# Patient Record
Sex: Male | Born: 1990 | Race: Black or African American | Hispanic: No | Marital: Single | Smoking: Current every day smoker
Health system: Southern US, Community
[De-identification: ages and names within clinical notes are randomized; demographics above are authoritative.]

---
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
## 2010-07-06 ENCOUNTER — Telehealth: Payer: Self-pay | Admitting: Orthopedic Surgery

## 2010-09-08 NOTE — Progress Notes (Signed)
Summary: patient scheduled today  Phone Note Call from Patient   Caller: Patient Summary of Call: Patient called day of appt 07/06/10( scheduled 2:15pm,) at 2:27pm and was 15 to 20 minutes away.  We advised need to to reschedule.  (Note - reminder call made 1 day prior, requested  to come 15 min early to register (new patient paperwork, etc).  He hung up on Brenda.   Initial call taken by: Cammie Sickle,  July 06, 2010 4:45 PM  Follow-up for Phone Call        Patient's mother called back regarding re-schedule. States patient was in class, taking a final.   I offered the first available appt-07/20/10.  Mother states she will have him go to another doctor.   Follow-up by: Cammie Sickle,  July 06, 2010 4:47 PM

## 2011-09-24 ENCOUNTER — Emergency Department (HOSPITAL_COMMUNITY)
Admission: EM | Admit: 2011-09-24 | Discharge: 2011-09-24 | Disposition: A | Discharge status: Home / Self Care | Payer: BC Managed Care – PPO | Attending: Emergency Medicine | Admitting: Emergency Medicine

## 2011-09-24 ENCOUNTER — Encounter (HOSPITAL_COMMUNITY): Payer: Self-pay

## 2011-09-24 DIAGNOSIS — J029 Acute pharyngitis, unspecified: Secondary | ICD-10-CM | POA: Insufficient documentation

## 2011-09-24 LAB — RAPID STREP SCREEN (MED CTR MEBANE ONLY): Streptococcus, Group A Screen (Direct): NEGATIVE

## 2011-09-24 MED ORDER — IBUPROFEN 600 MG PO TABS
600.0000 mg | ORAL_TABLET | Freq: Four times a day (QID) | ORAL | Status: AC | PRN
Start: 2011-09-24 — End: 2011-10-04

## 2011-09-24 MED ORDER — LIDOCAINE VISCOUS 2 % MT SOLN
20.0000 mL | Freq: Once | OROMUCOSAL | Status: AC
  Administered 2011-09-24: 15 mL via OROMUCOSAL
  Filled 2011-09-24: qty 15

## 2011-09-24 MED ORDER — LIDOCAINE VISCOUS 2 % MT SOLN: OROMUCOSAL | Status: DC

## 2011-09-24 NOTE — Discharge Instructions (Signed)

## 2011-09-24 NOTE — ED Notes (Signed)
Pt presents with sore throat x 2 days. 

## 2011-09-24 NOTE — ED Provider Notes (Signed)
History     CSN: [id number]  Arrival date & time 09/24/11  2034   First MD Initiated Contact with Patient 09/24/11 2046      Chief Complaint  Patient presents with  . Sore Throat    (Consider location/radiation/quality/duration/timing/severity/associated sxs/prior treatment) Patient is a 21 y.o. male presenting with pharyngitis. The history is provided by the patient.  Sore Throat This is a new problem. The current episode started yesterday. The problem occurs constantly. The problem has been unchanged. Associated symptoms include congestion and a sore throat. Pertinent negatives include no abdominal pain, arthralgias, chest pain, chills, coughing, fever, headaches, joint swelling, nausea, neck pain, numbness, rash, swollen glands or weakness. The symptoms are aggravated by swallowing. He has tried acetaminophen and NSAIDs for the symptoms. The treatment provided no relief.    History reviewed. No pertinent past medical history.  History reviewed. No pertinent past surgical history.  No family history on file.  History  Substance Use Topics  . Smoking status: Never Smoker   . Smokeless tobacco: Not on file  . Alcohol Use: Yes      Review of Systems  Constitutional: Negative for fever and chills.  HENT: Positive for congestion and sore throat. Negative for ear pain, rhinorrhea, neck pain, neck stiffness and voice change.   Eyes: Negative.   Respiratory: Negative for cough, chest tightness and shortness of breath.   Cardiovascular: Negative for chest pain.  Gastrointestinal: Negative for nausea and abdominal pain.  Genitourinary: Negative.   Musculoskeletal: Negative for joint swelling and arthralgias.  Skin: Negative.  Negative for rash and wound.  Neurological: Negative for weakness, numbness and headaches.  Hematological: Negative.   Psychiatric/Behavioral: Negative.     Allergies  Review of patient's allergies indicates no known allergies.  Home Medications    Current Outpatient Rx  Name Route Sig Dispense Refill  . IBUPROFEN 600 MG PO TABS Oral Take 1 tablet (600 mg total) by mouth every 6 (six) hours as needed for pain. 30 tablet 0  . LIDOCAINE VISCOUS 2 % MT SOLN  Gargle and spit every 3 hours as needed for pain relief 100 mL 0    BP 143/79  Pulse 65  Temp(Src) 97.9 F (36.6 C) (Oral)  Resp 20  Ht 5\' 6"  (1.676 m)  Wt 190 lb (86.183 kg)  BMI 30.67 kg/m2  SpO2 99%  Physical Exam  Nursing note and vitals reviewed. Constitutional: He is oriented to person, place, and time. He appears well-developed and well-nourished.  HENT:  Head: Normocephalic and atraumatic.  Right Ear: Tympanic membrane and ear canal normal.  Left Ear: Tympanic membrane and ear canal normal.  Nose: Mucosal edema present.  Mouth/Throat: Uvula is midline and mucous membranes are normal. Posterior oropharyngeal erythema present. No oropharyngeal exudate, posterior oropharyngeal edema or tonsillar abscesses.  Eyes: Conjunctivae are normal.  Neck: Normal range of motion.  Cardiovascular: Normal rate, regular rhythm, normal heart sounds and intact distal pulses.   Pulmonary/Chest: Effort normal and breath sounds normal. He has no wheezes.  Abdominal: Soft. Bowel sounds are normal. There is no tenderness.  Musculoskeletal: Normal range of motion.  Neurological: He is alert and oriented to person, place, and time.  Skin: Skin is warm and dry.  Psychiatric: He has a normal mood and affect.    ED Course  Procedures (including critical care time)   Labs Reviewed  RAPID STREP SCREEN   No results found.   1. Viral pharyngitis       MDM  Rapid  strep negative.  Ibuprofen,  Lidocaine.  Rest,  Fluids.          Candis Musa, PA 09/24/11 2318

## 2011-09-25 NOTE — ED Provider Notes (Signed)
Medical screening examination/treatment/procedure(s) were performed by non-physician practitioner and as supervising physician I was immediately available for consultation/collaboration.   Ayson Cherubini W. Taliya Mcclard, MD 09/25/11 1113 

## 2012-03-22 ENCOUNTER — Encounter (HOSPITAL_COMMUNITY): Payer: Self-pay | Admitting: Emergency Medicine

## 2012-03-22 ENCOUNTER — Emergency Department (HOSPITAL_COMMUNITY)
Admission: EM | Admit: 2012-03-22 | Discharge: 2012-03-22 | Disposition: A | Discharge status: Home / Self Care | Payer: No Typology Code available for payment source | Attending: Emergency Medicine | Admitting: Emergency Medicine

## 2012-03-22 DIAGNOSIS — Y93I9 Activity, other involving external motion: Secondary | ICD-10-CM | POA: Insufficient documentation

## 2012-03-22 DIAGNOSIS — S39012A Strain of muscle, fascia and tendon of lower back, initial encounter: Secondary | ICD-10-CM

## 2012-03-22 DIAGNOSIS — S335XXA Sprain of ligaments of lumbar spine, initial encounter: Secondary | ICD-10-CM | POA: Insufficient documentation

## 2012-03-22 DIAGNOSIS — Y998 Other external cause status: Secondary | ICD-10-CM | POA: Insufficient documentation

## 2012-03-22 MED ORDER — METHOCARBAMOL 500 MG PO TABS: ORAL_TABLET | ORAL | Status: DC

## 2012-03-22 MED ORDER — MELOXICAM 7.5 MG PO TABS: ORAL_TABLET | ORAL | Status: DC
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
## 2012-03-22 NOTE — ED Provider Notes (Signed)
Medical screening examination/treatment/procedure(s) were performed by non-physician practitioner and as supervising physician I was immediately available for consultation/collaboration.   Laray Anger, DO 03/22/12 1849

## 2012-03-22 NOTE — ED Notes (Signed)
H. Bryant, PA at bedside. 
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
## 2012-07-10 ENCOUNTER — Emergency Department (INDEPENDENT_AMBULATORY_CARE_PROVIDER_SITE_OTHER)
Admission: EM | Admit: 2012-07-10 | Discharge: 2012-07-10 | Disposition: A | Discharge status: Home / Self Care | Payer: BC Managed Care – PPO | Source: Home / Self Care | Attending: Emergency Medicine | Admitting: Emergency Medicine

## 2012-07-10 ENCOUNTER — Encounter (HOSPITAL_COMMUNITY): Payer: Self-pay | Admitting: *Deleted

## 2012-07-10 DIAGNOSIS — R609 Edema, unspecified: Secondary | ICD-10-CM

## 2012-07-10 DIAGNOSIS — B349 Viral infection, unspecified: Secondary | ICD-10-CM

## 2012-07-10 DIAGNOSIS — B9789 Other viral agents as the cause of diseases classified elsewhere: Secondary | ICD-10-CM

## 2012-07-10 LAB — POCT I-STAT, CHEM 8
BUN: 10 mg/dL (ref 6–23)
Calcium, Ion: 1.26 mmol/L — ABNORMAL HIGH (ref 1.12–1.23)
Creatinine, Ser: 1.1 mg/dL (ref 0.50–1.35)
HCT: 48 % (ref 39.0–52.0)
Potassium: 4.4 mEq/L (ref 3.5–5.1)
Sodium: 141 mEq/L (ref 135–145)
TCO2: 28 mmol/L (ref 0–100)

## 2012-07-10 LAB — POCT URINALYSIS DIP (DEVICE)
Glucose, UA: NEGATIVE mg/dL
Ketones, ur: NEGATIVE mg/dL
Specific Gravity, Urine: 1.02 (ref 1.005–1.030)
Urobilinogen, UA: 0.2 mg/dL (ref 0.0–1.0)

## 2012-07-10 MED ORDER — DIPHENOXYLATE-ATROPINE 2.5-0.025 MG PO TABS: 1.0000 | ORAL_TABLET | Freq: Four times a day (QID) | ORAL | Status: DC | PRN

## 2012-07-10 MED ORDER — BENZONATATE 200 MG PO CAPS: 200.0000 mg | ORAL_CAPSULE | Freq: Three times a day (TID) | ORAL | Status: DC | PRN

## 2012-07-10 MED ORDER — FEXOFENADINE-PSEUDOEPHED ER 60-120 MG PO TB12: 1.0000 | ORAL_TABLET | Freq: Two times a day (BID) | ORAL | Status: DC

## 2012-07-10 NOTE — ED Provider Notes (Signed)
Chief Complaint  Patient presents with  . Cough    History of Present Illness:   Jacob Webster is a 21 year old male who presents today with several problems: Swelling of the toes of both feet, postural dizziness, weakness, nasal congestion, chest pain, cough, and diarrhea. The swelling of the toes goes back to yesterday. He denies any swelling of the feet or ankles, just the toes. They're not painful. He denies any pain in the legs or calf. He's had no PND, orthopnea, dyspnea on exertion, or shortness of breath. He's also had postural dizziness since yesterday. He states whenever he gets up from a sitting or lying position he feels lightheaded. He denies any vertigo or syncope. He feels weak in his arms and legs. There is no numbness or tingling. No headache or other neurological symptoms. He's also had a two-day history of nasal congestion, rhinorrhea, and cough productive of brown sputum. His stools have been loose. He denies any abdominal pain, nausea, or vomiting. He has sharp chest pain for the past 2 weeks it comes and goes but is less submammary and last for seconds at a time.  Review of Systems:  Other than noted above, the patient denies any of the following symptoms. Systemic:  No fever, chills, sweats, fatigue, myalgias, headache, or anorexia. Eye:  No redness, pain or drainage. ENT:  No earache, ear congestion, nasal congestion, sneezing, rhinorrhea, sinus pressure, sinus pain, post nasal drip, or sore throat. Lungs:  No cough, sputum production, wheezing, shortness of breath, or chest pain. GI:  No abdominal pain, nausea, vomiting, or diarrhea.  PMFSH:  Past medical history, family history, social history, meds, and allergies were reviewed.  Physical Exam:   Vital signs:  BP 123/75  Pulse 77  Temp 99.1 F (37.3 C) (Oral)  Resp 17  SpO2 100% Filed Vitals:   07/10/12 1233 07/10/12 1318 Supine  07/10/12 1318 Sitting  07/10/12 1319 Standing   BP: 120/77 122/79 116/56 123/75  Pulse: 73  68 72 77  Temp: 99.1 F (37.3 C)     TempSrc: Oral     Resp: 17     SpO2: 100%      General:  Alert, in no distress. Eye:  No conjunctival injection or drainage. Lids were normal. ENT:  TMs and canals were normal, without erythema or inflammation.  Nasal mucosa was clear and uncongested, without drainage.  Mucous membranes were moist.  Pharynx was clear, without exudate or drainage.  There were no oral ulcerations or lesions. Neck:  Supple, no adenopathy, tenderness or mass. Lungs:  No respiratory distress.  Lungs were clear to auscultation, without wheezes, rales or rhonchi.  Breath sounds were clear and equal bilaterally.  Heart:  Regular rhythm, without gallops, murmers or rubs. Abdomen: Soft, nontender, no organomegaly or mass. Extremities: No obvious edema of the toes, feet, ankles. There is no pitting edema. No tenderness to palpation. No calf tenderness, and Homans sign is negative. There is no distention of the veins or palpable cord. Skin:  Clear, warm, and dry, without rash or lesions.  Labs:   Results for orders placed during the hospital encounter of 07/10/12  POCT URINALYSIS DIP (DEVICE)      Component Value Range   Glucose, UA NEGATIVE  NEGATIVE mg/dL   Bilirubin Urine NEGATIVE  NEGATIVE   Ketones, ur NEGATIVE  NEGATIVE mg/dL   Specific Gravity, Urine 1.020  1.005 - 1.030   Hgb urine dipstick NEGATIVE  NEGATIVE   pH 7.0  5.0 - 8.0  Protein, ur NEGATIVE  NEGATIVE mg/dL   Urobilinogen, UA 0.2  0.0 - 1.0 mg/dL   Nitrite NEGATIVE  NEGATIVE   Leukocytes, UA NEGATIVE  NEGATIVE  POCT I-STAT, CHEM 8      Component Value Range   Sodium 141  135 - 145 mEq/L   Potassium 4.4  3.5 - 5.1 mEq/L   Chloride 104  96 - 112 mEq/L   BUN 10  6 - 23 mg/dL   Creatinine, Ser 1.61  0.50 - 1.35 mg/dL   Glucose, Bld 95  70 - 99 mg/dL   Calcium, Ion 0.96 (*) 1.12 - 1.23 mmol/L   TCO2 28  0 - 100 mmol/L   Hemoglobin 16.3  13.0 - 17.0 g/dL   HCT 04.5  40.9 - 81.1 %    Date: 07/10/2012   Rate: 61  Rhythm: normal sinus rhythm  QRS Axis: normal  Intervals: normal  ST/T Wave abnormalities: normal  Conduction Disutrbances:none  Narrative Interpretation: Normal sinus rhythm with sinus arrhythmia, normal EKG.  Old EKG Reviewed: none available  Assessment:  The primary encounter diagnosis was Edema. A diagnosis of Viral syndrome was also pertinent to this visit.  I am not certain as to the cause of his edema. I cannot appreciate any definite edema of the toes, feet, ankles today. He has not had any swelling of his hands, abdomen, or face. I suggest he followup with a primary care Dr. if this has not resolved within the next few days.  Plan:   1.  The following meds were prescribed:   New Prescriptions   BENZONATATE (TESSALON) 200 MG CAPSULE    Take 1 capsule (200 mg total) by mouth 3 (three) times daily as needed for cough.   DIPHENOXYLATE-ATROPINE (LOMOTIL) 2.5-0.025 MG PER TABLET    Take 1 tablet by mouth 4 (four) times daily as needed for diarrhea or loose stools.   FEXOFENADINE-PSEUDOEPHEDRINE (ALLEGRA-D) 60-120 MG PER TABLET    Take 1 tablet by mouth every 12 (twelve) hours.   2.  The patient was instructed in symptomatic care and handouts were given. 3.  The patient was told to return if becoming worse in any way, if no better in 3 or 4 days, and given some red flag symptoms that would indicate earlier return.   Reuben Likes, MD 07/10/12 [phone]

## 2012-07-10 NOTE — ED Notes (Signed)
Pt  Has   Symptoms  Of  Non        Productive cough      With  Sinus    Congestion        sorethroat     And  Feet swelling        Pt   Also  Reports  Being  Dizzy        As  Well

## 2013-02-28 ENCOUNTER — Emergency Department (HOSPITAL_COMMUNITY)
Admission: EM | Admit: 2013-02-28 | Discharge: 2013-02-28 | Disposition: A | Discharge status: Home / Self Care | Payer: BC Managed Care – PPO | Attending: Emergency Medicine | Admitting: Emergency Medicine

## 2013-02-28 ENCOUNTER — Encounter (HOSPITAL_COMMUNITY): Payer: Self-pay | Admitting: Emergency Medicine

## 2013-02-28 DIAGNOSIS — F17200 Nicotine dependence, unspecified, uncomplicated: Secondary | ICD-10-CM | POA: Insufficient documentation

## 2013-02-28 DIAGNOSIS — B029 Zoster without complications: Secondary | ICD-10-CM

## 2013-02-28 DIAGNOSIS — Z79899 Other long term (current) drug therapy: Secondary | ICD-10-CM | POA: Insufficient documentation

## 2013-02-28 MED ORDER — ONDANSETRON 4 MG PO TBDP: 4.0000 mg | ORAL_TABLET | Freq: Three times a day (TID) | ORAL | Status: DC | PRN

## 2013-02-28 MED ORDER — GABAPENTIN 400 MG PO CAPS
400.0000 mg | ORAL_CAPSULE | Freq: Once | ORAL | Status: AC
  Administered 2013-02-28: 400 mg via ORAL
  Filled 2013-02-28: qty 1

## 2013-02-28 MED ORDER — HYDROCODONE-ACETAMINOPHEN 5-325 MG PO TABS
1.0000 | ORAL_TABLET | Freq: Once | ORAL | Status: AC
  Administered 2013-02-28: 1 via ORAL
  Filled 2013-02-28: qty 1

## 2013-02-28 MED ORDER — PREDNISONE 10 MG PO TABS
60.0000 mg | ORAL_TABLET | Freq: Once | ORAL | Status: AC
  Administered 2013-02-28: 60 mg via ORAL
  Filled 2013-02-28: qty 1

## 2013-02-28 MED ORDER — PREDNISONE 10 MG PO TABS: 20.0000 mg | ORAL_TABLET | Freq: Every day | ORAL | Status: DC

## 2013-02-28 MED ORDER — GABAPENTIN 300 MG PO CAPS: 300.0000 mg | ORAL_CAPSULE | Freq: Three times a day (TID) | ORAL | Status: DC

## 2013-02-28 MED ORDER — ACYCLOVIR 800 MG PO TABS
800.0000 mg | ORAL_TABLET | Freq: Once | ORAL | Status: AC
Start: 2013-02-28 — End: 2013-02-28
  Administered 2013-02-28: 800 mg via ORAL
  Filled 2013-02-28: qty 1

## 2013-02-28 MED ORDER — ACYCLOVIR 400 MG PO TABS: 800.0000 mg | ORAL_TABLET | Freq: Every day | ORAL | Status: DC

## 2013-02-28 MED ORDER — HYDROCODONE-ACETAMINOPHEN 5-325 MG PO TABS: 1.0000 | ORAL_TABLET | ORAL | Status: DC | PRN

## 2013-02-28 NOTE — ED Notes (Signed)
Pt sleeping at present time.  Respirations regular, even and unlabored.   

## 2013-02-28 NOTE — ED Notes (Signed)
Patient c/o rash to left side that spreads from front to back; states stings and itches.  Patient states has been using Calamine lotion without relief.

## 2013-02-28 NOTE — ED Provider Notes (Signed)
CSN: [id number]     Arrival date & time 02/28/13  0024 History     First MD Initiated Contact with Patient 02/28/13 0053     Chief Complaint  Patient presents with  . Rash   (Consider location/radiation/quality/duration/timing/severity/associated sxs/prior Treatment) HPI HPI Comments: Jacob Webster is a 22 y.o. male who presents to the Emergency Department complaining of rash to his left side that has been present for several days. It started out as two spots and has now spread to the back and front. He thinks it is poison oak or ivy. He has treated it with calamine lotion with no effect.  PCP Dr. Olena Leatherwood History reviewed. No pertinent past medical history. History reviewed. No pertinent past surgical history. No family history on file. History  Substance Use Topics  . Smoking status: Current Every Day Smoker  . Smokeless tobacco: Not on file  . Alcohol Use: Yes    Review of Systems  Skin:       rash    Allergies  Review of patient's allergies indicates no known allergies.  Home Medications   Current Outpatient Rx  Name  Route  Sig  Dispense  Refill  . acyclovir (ZOVIRAX) 400 MG tablet   Oral   Take 2 tablets (800 mg total) by mouth 5 (five) times daily.   50 tablet   0   . benzonatate (TESSALON) 200 MG capsule   Oral   Take 1 capsule (200 mg total) by mouth 3 (three) times daily as needed for cough.   30 capsule   0   . diphenoxylate-atropine (LOMOTIL) 2.5-0.025 MG per tablet   Oral   Take 1 tablet by mouth 4 (four) times daily as needed for diarrhea or loose stools.   16 tablet   0   . fexofenadine-pseudoephedrine (ALLEGRA-D) 60-120 MG per tablet   Oral   Take 1 tablet by mouth every 12 (twelve) hours.   30 tablet   0   . gabapentin (NEURONTIN) 300 MG capsule   Oral   Take 1 capsule (300 mg total) by mouth 3 (three) times daily.   30 capsule   0   . HYDROcodone-acetaminophen (NORCO/VICODIN) 5-325 MG per tablet   Oral   Take 1 tablet by mouth  every 4 (four) hours as needed for pain.   15 tablet   0   . meloxicam (MOBIC) 7.5 MG tablet      1 po bid with food   12 tablet   0   . methocarbamol (ROBAXIN) 500 MG tablet      2 po tid for spasm   30 tablet   0   . ondansetron (ZOFRAN ODT) 4 MG disintegrating tablet   Oral   Take 1 tablet (4 mg total) by mouth every 8 (eight) hours as needed for nausea.   20 tablet   0   . predniSONE (DELTASONE) 10 MG tablet   Oral   Take 2 tablets (20 mg total) by mouth daily.   10 tablet   0    BP 139/75  Pulse 73  Temp(Src) 98.2 F (36.8 C) (Oral)  Resp 18  Ht 5\' 5"  (1.651 m)  Wt 190 lb (86.183 kg)  BMI 31.62 kg/m2  SpO2 99% Physical Exam  Nursing note and vitals reviewed. Constitutional: He appears well-developed and well-nourished.  Awake, alert, nontoxic appearance.  HENT:  Head: Normocephalic and atraumatic.  Eyes: EOM are normal. Pupils are equal, round, and reactive to light.  Neck: Normal  range of motion. Neck supple.  Cardiovascular: Normal rate and intact distal pulses.   Pulmonary/Chest: Effort normal and breath sounds normal. He exhibits no tenderness.  Abdominal: Soft. There is no tenderness. There is no rebound.  Musculoskeletal: He exhibits no tenderness.  Baseline ROM, no obvious new focal weakness.  Neurological:  Mental status and motor strength appears baseline for patient and situation.  Skin: No rash noted.  Raised vesicular rash in T11 distribution with no crossing over the midline in either the back or front c/w shingles  Psychiatric: He has a normal mood and affect.    ED Course   Procedures (including critical care time)  Medications  acyclovir (ZOVIRAX) tablet 800 mg (800 mg Oral Given 02/28/13 0218)  gabapentin (NEURONTIN) capsule 400 mg (400 mg Oral Given 02/28/13 0218)  HYDROcodone-acetaminophen (NORCO/VICODIN) 5-325 MG per tablet 1 tablet (1 tablet Oral Given 02/28/13 0218)  predniSONE (DELTASONE) tablet 60 mg (60 mg Oral Given 02/28/13  0218)    No results found. 1. Shingles     MDM  Patient presents with a rash in the T11 distribution c/w shingles. Treated with initial doses of acyclovir, neurontin, vicodin, and prednisone.Pt stable in ED with no significant deterioration in condition.The patient appears reasonably screened and/or stabilized for discharge and I doubt any other medical condition or other [facility] requiring further screening, evaluation, or treatment in the ED at this time prior to discharge.  MDM Reviewed: nursing note and vitals     Nicoletta Dress. Colon Branch, MD 02/28/13 [phone]

## 2014-07-01 ENCOUNTER — Emergency Department (HOSPITAL_COMMUNITY): Payer: BC Managed Care – PPO

## 2014-07-01 ENCOUNTER — Emergency Department (HOSPITAL_COMMUNITY)
Admission: EM | Admit: 2014-07-01 | Discharge: 2014-07-01 | Disposition: A | Discharge status: Home / Self Care | Payer: BC Managed Care – PPO | Attending: Emergency Medicine | Admitting: Emergency Medicine

## 2014-07-01 ENCOUNTER — Encounter (HOSPITAL_COMMUNITY): Payer: Self-pay | Admitting: Emergency Medicine

## 2014-07-01 DIAGNOSIS — S61210A Laceration without foreign body of right index finger without damage to nail, initial encounter: Secondary | ICD-10-CM | POA: Diagnosis not present

## 2014-07-01 DIAGNOSIS — S61216A Laceration without foreign body of right little finger without damage to nail, initial encounter: Secondary | ICD-10-CM | POA: Diagnosis not present

## 2014-07-01 DIAGNOSIS — Z720 Tobacco use: Secondary | ICD-10-CM | POA: Insufficient documentation

## 2014-07-01 DIAGNOSIS — Y9289 Other specified places as the place of occurrence of the external cause: Secondary | ICD-10-CM | POA: Diagnosis not present

## 2014-07-01 DIAGNOSIS — Y998 Other external cause status: Secondary | ICD-10-CM | POA: Diagnosis not present

## 2014-07-01 DIAGNOSIS — Z79899 Other long term (current) drug therapy: Secondary | ICD-10-CM | POA: Diagnosis not present

## 2014-07-01 DIAGNOSIS — W25XXXA Contact with sharp glass, initial encounter: Secondary | ICD-10-CM | POA: Insufficient documentation

## 2014-07-01 DIAGNOSIS — IMO0002 Reserved for concepts with insufficient information to code with codable children: Secondary | ICD-10-CM

## 2014-07-01 DIAGNOSIS — S61411A Laceration without foreign body of right hand, initial encounter: Secondary | ICD-10-CM | POA: Diagnosis not present

## 2014-07-01 DIAGNOSIS — Z791 Long term (current) use of non-steroidal anti-inflammatories (NSAID): Secondary | ICD-10-CM | POA: Diagnosis not present

## 2014-07-01 DIAGNOSIS — Z7952 Long term (current) use of systemic steroids: Secondary | ICD-10-CM | POA: Diagnosis not present

## 2014-07-01 DIAGNOSIS — Y9389 Activity, other specified: Secondary | ICD-10-CM | POA: Diagnosis not present

## 2014-07-01 MED ORDER — BACITRACIN-NEOMYCIN-POLYMYXIN 400-5-5000 EX OINT
TOPICAL_OINTMENT | Freq: Once | CUTANEOUS | Status: AC
  Administered 2014-07-01: 1 via TOPICAL
  Filled 2014-07-01: qty 1

## 2014-07-01 MED ORDER — LIDOCAINE HCL (PF) 2 % IJ SOLN
INTRAMUSCULAR | Status: AC
  Administered 2014-07-01: 23:00:00
  Filled 2014-07-01: qty 10
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
## 2014-07-01 NOTE — ED Provider Notes (Addendum)
CSN: [id number]     Arrival date & time 07/01/14  1953 History   First MD Initiated Contact with Patient 07/01/14 2050     Chief Complaint  Patient presents with  . Laceration     (Consider location/radiation/quality/duration/timing/severity/associated sxs/prior Treatment) Patient is a 23 y.o. male presenting with skin laceration.  Laceration Location:  Hand Hand laceration location:  R finger (right wrist) Depth:  Cutaneous Bleeding: controlled   Laceration mechanism:  Metal edge Pain details:    Quality:  Aching and throbbing   Severity:  Moderate   Timing:  Intermittent   Progression:  Worsening Foreign body present:  No foreign bodies Relieved by:  Nothing Worsened by:  Nothing tried Ineffective treatments:  None tried Tetanus status:  Up to date   History reviewed. No pertinent past medical history. History reviewed. No pertinent past surgical history. History reviewed. No pertinent family history. History  Substance Use Topics  . Smoking status: Current Every Day Smoker  . Smokeless tobacco: Not on file  . Alcohol Use: Yes    Review of Systems  Constitutional: Negative for activity change.       All ROS Neg except as noted in HPI  Eyes: Negative for photophobia and discharge.  Respiratory: Negative for cough, shortness of breath and wheezing.   Cardiovascular: Negative for chest pain and palpitations.  Gastrointestinal: Negative for abdominal pain and blood in stool.  Genitourinary: Negative for dysuria, frequency and hematuria.  Musculoskeletal: Negative for back pain, arthralgias and neck pain.  Skin: Negative.   Neurological: Negative for dizziness, seizures and speech difficulty.  Psychiatric/Behavioral: Negative for hallucinations and confusion.      Allergies  Review of patient's allergies indicates no known allergies.  Home Medications   Prior to Admission medications   Medication Sig Start Date End Date Taking? Authorizing Provider   acyclovir (ZOVIRAX) 400 MG tablet Take 2 tablets (800 mg total) by mouth 5 (five) times daily. 02/28/13   Annamarie Dawleyerry S Strand, MD  benzonatate (TESSALON) 200 MG capsule Take 1 capsule (200 mg total) by mouth 3 (three) times daily as needed for cough. 07/10/12   Reuben Likesavid C Keller, MD  diphenoxylate-atropine (LOMOTIL) 2.5-0.025 MG per tablet Take 1 tablet by mouth 4 (four) times daily as needed for diarrhea or loose stools. 07/10/12   Reuben Likesavid C Keller, MD  fexofenadine-pseudoephedrine (ALLEGRA-D) 60-120 MG per tablet Take 1 tablet by mouth every 12 (twelve) hours. 07/10/12   Reuben Likesavid C Keller, MD  gabapentin (NEURONTIN) 300 MG capsule Take 1 capsule (300 mg total) by mouth 3 (three) times daily. 02/28/13   Annamarie Dawleyerry S Strand, MD  HYDROcodone-acetaminophen (NORCO/VICODIN) 5-325 MG per tablet Take 1 tablet by mouth every 4 (four) hours as needed for pain. 02/28/13   Annamarie Dawleyerry S Strand, MD  meloxicam (MOBIC) 7.5 MG tablet 1 po bid with food 03/22/12   Kathie DikeHobson M Yaw Escoto, PA-C  methocarbamol (ROBAXIN) 500 MG tablet 2 po tid for spasm 03/22/12   Kathie DikeHobson M Tigerlily Christine, PA-C  ondansetron (ZOFRAN ODT) 4 MG disintegrating tablet Take 1 tablet (4 mg total) by mouth every 8 (eight) hours as needed for nausea. 02/28/13   Annamarie Dawleyerry S Strand, MD  predniSONE (DELTASONE) 10 MG tablet Take 2 tablets (20 mg total) by mouth daily. 02/28/13   Annamarie Dawleyerry S Strand, MD   BP 135/80 mmHg  Pulse 81  Temp(Src) 98.9 F (37.2 C) (Oral)  Resp 24  Ht 5\' 5"  (1.651 m)  Wt 190 lb (86.183 kg)  BMI 31.62 kg/m2  SpO2 100%  Physical Exam  Constitutional: He is oriented to person, place, and time. He appears well-developed and well-nourished.  Non-toxic appearance.  HENT:  Head: Normocephalic.  Right Ear: Tympanic membrane and external ear normal.  Left Ear: Tympanic membrane and external ear normal.  Eyes: EOM and lids are normal. Pupils are equal, round, and reactive to light.  Neck: Normal range of motion. Neck supple. Carotid bruit is not present.  Cardiovascular: Normal  rate, regular rhythm, normal heart sounds, intact distal pulses and normal pulses.   Pulmonary/Chest: Breath sounds normal. No respiratory distress.  Abdominal: Soft. Bowel sounds are normal. There is no tenderness. There is no guarding.  Musculoskeletal: Normal range of motion.  1.2 cm Right index finger laceration.  1.4 cm laceration of the right palmar aspect of the wrist.  Shallow laceration of the dorsum of the 5th finger.   Lymphadenopathy:       Head (right side): No submandibular adenopathy present.       Head (left side): No submandibular adenopathy present.    He has no cervical adenopathy.  Neurological: He is alert and oriented to person, place, and time. He has normal strength. No cranial nerve deficit or sensory deficit. He exhibits normal muscle tone. Coordination normal.  Skin: Skin is warm and dry.  Psychiatric: He has a normal mood and affect. His speech is normal.  Nursing note and vitals reviewed.   ED Course  LACERATION REPAIR Date/Time: 07/01/2014 10:20 PM Performed by: Kathie Dike Authorized by: Kathie Dike Consent: Verbal consent obtained. Risks and benefits: risks, benefits and alternatives were discussed Consent given by: patient Patient understanding: patient states understanding of the procedure being performed Required items: required blood products, implants, devices, and special equipment available Patient identity confirmed: arm band Time out: Immediately prior to procedure a "time out" was called to verify the correct patient, procedure, equipment, support staff and site/side marked as required. Body area: upper extremity Location details: right hand Laceration length: 1.2 cm Foreign bodies: no foreign bodies Tendon involvement: none Nerve involvement: none Vascular damage: no Anesthesia: local infiltration Local anesthetic: lidocaine 1% without epinephrine Patient sedated: no Preparation: Patient was prepped and draped in the usual  sterile fashion. Irrigation solution: saline Amount of cleaning: standard Skin closure: 4-0 Prolene Number of sutures: 2 Technique: simple Approximation: close Approximation difficulty: simple Dressing: gauze roll Patient tolerance: Patient tolerated the procedure well with no immediate complications Comments: 1.4cm laceration of the right wrist repaired with two interrupted sutures of 4-0 prolene. Bandage applied. Pt tolerated procedure without problem.   (including critical care time) Labs Review Labs Reviewed - No data to display  Imaging Review Dg Hand Complete Right  07/01/2014   CLINICAL DATA:  Middle finger laceration.  Initial encounter  EXAM: RIGHT HAND - COMPLETE 3+ VIEW  COMPARISON:  None.  FINDINGS: There is no evidence of fracture or dislocation. No radiopaque foreign body. Chronic appearing ossific density at the ulnar styloid process which is likely from remote fracture.  IMPRESSION: No acute fracture or radiopaque foreign body.   Electronically Signed   By: Tiburcio Pea M.D.   On: 07/01/2014 21:45     EKG Interpretation None      MDM  Xray is negative for fracture or foreign body. Wounds repaired with prolene sutures. Pt advised to  Have sutures removed in 7 or 8 days. Pt instructed on how to keep the wound clean and dry. He will return if any changes or problem.   Final diagnoses:  Laceration    *I have reviewed nursing notes, vital signs, and all appropriate lab and imaging results for this patient.[address]**    Pete Schnitzer Garry HeaterM Arnez Stoneking, PA-C 07/01/14 2225  Ward GivensIva L Knapp, MD 07/01/14 2232  Kathie DikeHobson M Aaronjames Kelsay, PA-C 07/21/14 [id number]  Ward GivensIva L Knapp, MD 07/21/14 [phone]

## 2014-07-01 NOTE — Discharge Instructions (Signed)
Sutured Wound Care       Sutures are stitches that can be used to close wounds. Caring for your wound can help stop infection and lessen pain.   HOME CARE   Rest and raise (elevate) the injured area until the pain and puffiness (swelling) go away.   Only take medicines as told by your doctor.   Clean the wound gently with mild soap and water once a day after the first 2 days. Rinse off the soap. Pat the area dry with a clean towel. Do not rub the wound.   Change the bandage (dressing) as told by your doctor. If the bandage sticks, soak it off with soapy water. Stop using a bandage after 2 days or after the wound stops leaking fluid.   Put cream on the wound as told by your doctor.   Do not stretch the wound.   Drink enough fluids to keep your pee (urine) clear or pale yellow.   See your doctor to have the sutures removed.   Use sunscreen or sunblock on the wound after it heals.  GET HELP RIGHT AWAY IF:   Your wound gets red, puffy, hot, or tender.   You have more pain in the wound.   You have a red streak that goes away from the wound.   You see yellowish-white fluid (pus) coming out of the wound.   You have a fever.   You have chills and start to shake.   You notice a bad smell coming from the wound.   Your wound will not stop bleeding.  MAKE SURE YOU:   Understand these instructions.   Will watch your condition.   Will get help right away if you are not doing well or get worse.  Document Released: 01/10/2008 Document Revised: 10/16/2011 Document Reviewed: 11/27/2010   Exit[employer] Patient Information 2015 [employer]. This information is not intended to replace advice given to you by your health care provider. Make sure you discuss any questions you have with your health care provider.

## 2014-07-12 ENCOUNTER — Emergency Department (HOSPITAL_COMMUNITY)
Admission: EM | Admit: 2014-07-12 | Discharge: 2014-07-12 | Disposition: A | Discharge status: Home / Self Care | Payer: BC Managed Care – PPO | Attending: Emergency Medicine | Admitting: Emergency Medicine

## 2014-07-12 ENCOUNTER — Encounter (HOSPITAL_COMMUNITY): Payer: Self-pay

## 2014-07-12 DIAGNOSIS — Z720 Tobacco use: Secondary | ICD-10-CM | POA: Diagnosis not present

## 2014-07-12 DIAGNOSIS — Z79899 Other long term (current) drug therapy: Secondary | ICD-10-CM | POA: Diagnosis not present

## 2014-07-12 DIAGNOSIS — Z791 Long term (current) use of non-steroidal anti-inflammatories (NSAID): Secondary | ICD-10-CM | POA: Diagnosis not present

## 2014-07-12 DIAGNOSIS — Z7952 Long term (current) use of systemic steroids: Secondary | ICD-10-CM | POA: Insufficient documentation

## 2014-07-12 DIAGNOSIS — Z4802 Encounter for removal of sutures: Secondary | ICD-10-CM | POA: Diagnosis present

## 2014-07-12 NOTE — ED Notes (Signed)
Suture removal right wrist

## 2014-07-12 NOTE — Discharge Instructions (Signed)

## 2014-07-12 NOTE — ED Provider Notes (Signed)
CSN: [id number]     Arrival date & time 07/12/14  2158 History   First MD Initiated Contact with Patient 07/12/14 2210     Chief Complaint  Patient presents with  . Suture / Staple Removal     (Consider location/radiation/quality/duration/timing/severity/associated sxs/prior Treatment) HPI Rudene Christiansric S Boesen is a 23 y.o. male who presents to the ED for suture removal of the right wrist. He denies any problems.  History reviewed. No pertinent past medical history. History reviewed. No pertinent past surgical history. No family history on file. History  Substance Use Topics  . Smoking status: Current Every Day Smoker -- 0.00 packs/day    Types: Cigars  . Smokeless tobacco: Not on file  . Alcohol Use: Yes    Review of Systems Negative except as stated in HPI   Allergies  Review of patient's allergies indicates no known allergies.  Home Medications   Prior to Admission medications   Medication Sig Start Date End Date Taking? Authorizing Provider  acyclovir (ZOVIRAX) 400 MG tablet Take 2 tablets (800 mg total) by mouth 5 (five) times daily. 02/28/13   Annamarie Dawleyerry S Strand, MD  benzonatate (TESSALON) 200 MG capsule Take 1 capsule (200 mg total) by mouth 3 (three) times daily as needed for cough. 07/10/12   Reuben Likesavid C Keller, MD  diphenoxylate-atropine (LOMOTIL) 2.5-0.025 MG per tablet Take 1 tablet by mouth 4 (four) times daily as needed for diarrhea or loose stools. 07/10/12   Reuben Likesavid C Keller, MD  fexofenadine-pseudoephedrine (ALLEGRA-D) 60-120 MG per tablet Take 1 tablet by mouth every 12 (twelve) hours. 07/10/12   Reuben Likesavid C Keller, MD  gabapentin (NEURONTIN) 300 MG capsule Take 1 capsule (300 mg total) by mouth 3 (three) times daily. 02/28/13   Annamarie Dawleyerry S Strand, MD  HYDROcodone-acetaminophen (NORCO/VICODIN) 5-325 MG per tablet Take 1 tablet by mouth every 4 (four) hours as needed for pain. 02/28/13   Annamarie Dawleyerry S Strand, MD  meloxicam (MOBIC) 7.5 MG tablet 1 po bid with food 03/22/12   Kathie DikeHobson M Bryant, PA-C   methocarbamol (ROBAXIN) 500 MG tablet 2 po tid for spasm 03/22/12   Kathie DikeHobson M Bryant, PA-C  ondansetron (ZOFRAN ODT) 4 MG disintegrating tablet Take 1 tablet (4 mg total) by mouth every 8 (eight) hours as needed for nausea. 02/28/13   Annamarie Dawleyerry S Strand, MD  predniSONE (DELTASONE) 10 MG tablet Take 2 tablets (20 mg total) by mouth daily. 02/28/13   Annamarie Dawleyerry S Strand, MD   BP 119/69 mmHg  Pulse 84  Temp(Src) 98.9 F (37.2 C)  Resp 16  Ht 5\' 5"  (1.651 m)  Wt 190 lb (86.183 kg)  BMI 31.62 kg/m2  SpO2 99% Physical Exam  Constitutional: He is oriented to person, place, and time. He appears well-developed and well-nourished.  HENT:  Head: Normocephalic.  Eyes: Conjunctivae and EOM are normal.  Neck: Neck supple.  Cardiovascular: Normal rate.   Pulmonary/Chest: Effort normal.  Musculoskeletal: Normal range of motion.  Sutures in place right wrist and right middle finger without redness, drainage or other signs of infection.   Neurological: He is alert and oriented to person, place, and time. No cranial nerve deficit.  Skin: Skin is warm and dry.  Psychiatric: He has a normal mood and affect. His behavior is normal.  Nursing note and vitals reviewed.   ED Course  Procedures (including critical care time) Labs Review  MDM  23 y.o. male with healing wounds to the right wrist and right middle finger with out signs of infection. He will  return as needed for any problems.     [place]leyville[place] M Neese, NP 07/12/14 2219  Benny LennertJoseph L Zammit, MD 07/12/14 [id number]

## 2014-07-12 NOTE — ED Notes (Signed)
System downtime. Signature section not working.

## 2014-12-02 ENCOUNTER — Encounter (HOSPITAL_COMMUNITY): Payer: Self-pay | Admitting: Emergency Medicine

## 2014-12-02 ENCOUNTER — Emergency Department (INDEPENDENT_AMBULATORY_CARE_PROVIDER_SITE_OTHER)
Admission: EM | Admit: 2014-12-02 | Discharge: 2014-12-02 | Disposition: A | Discharge status: Home / Self Care | Payer: 59 | Source: Home / Self Care | Attending: Family Medicine | Admitting: Family Medicine

## 2014-12-02 DIAGNOSIS — K529 Noninfective gastroenteritis and colitis, unspecified: Secondary | ICD-10-CM | POA: Diagnosis not present

## 2014-12-02 MED ORDER — ONDANSETRON 4 MG PO TBDP: 4.0000 mg | ORAL_TABLET | Freq: Three times a day (TID) | ORAL | Status: DC | PRN

## 2014-12-02 MED ORDER — ONDANSETRON 4 MG PO TBDP
4.0000 mg | ORAL_TABLET | Freq: Once | ORAL | Status: AC
  Administered 2014-12-02: 4 mg via ORAL

## 2014-12-02 MED ORDER — ONDANSETRON 4 MG PO TBDP
ORAL_TABLET | ORAL | Status: AC
  Filled 2014-12-02: qty 1

## 2014-12-02 NOTE — ED Provider Notes (Signed)
CSN: [id number]     Arrival date & time 12/02/14  4098 History   First MD Initiated Contact with Patient 12/02/14 1001     Chief Complaint  Patient presents with  . Abdominal Pain   (Consider location/radiation/quality/duration/timing/severity/associated sxs/prior Treatment) HPI Comments: Reports himself to be otherwise healthy. Works in [employer] PCP: in [place], [place]   Patient is a 24 y.o. male presenting with diarrhea.  Diarrhea Quality:  Watery Severity:  Moderate Onset quality:  Gradual Duration:  2 days Timing:  Intermittent Progression:  Unchanged Relieved by:  None tried Worsened by:  Nothing tried Ineffective treatments:  None tried Associated symptoms: myalgias   Associated symptoms: no chills, no diaphoresis, no headaches and no vomiting   Associated symptoms comment:  +occasional abdominal cramping, intermittent nausea and subjective tactile fevers Risk factors: no recent antibiotic use, no sick contacts, no suspicious food intake and no travel to endemic areas     History reviewed. No pertinent past medical history. History reviewed. No pertinent past surgical history. No family history on file. History  Substance Use Topics  . Smoking status: Current Every Day Smoker -- 0.00 packs/day    Types: Cigars  . Smokeless tobacco: Not on file  . Alcohol Use: Yes    Review of Systems  Constitutional: Negative for chills and diaphoresis.  Gastrointestinal: Positive for diarrhea. Negative for vomiting.  Musculoskeletal: Positive for myalgias.  Neurological: Negative for headaches.  All other systems reviewed and are negative.   Allergies  Review of patient's allergies indicates no known allergies.  Home Medications   Prior to Admission medications   Medication Sig Start Date End Date Taking? Authorizing Provider  acyclovir (ZOVIRAX) 400 MG tablet Take 2 tablets (800 mg total) by mouth 5 (five) times daily. 02/28/13   Annamarie Dawley, MD  benzonatate (TESSALON)  200 MG capsule Take 1 capsule (200 mg total) by mouth 3 (three) times daily as needed for cough. 07/10/12   Reuben Likes, MD  diphenoxylate-atropine (LOMOTIL) 2.5-0.025 MG per tablet Take 1 tablet by mouth 4 (four) times daily as needed for diarrhea or loose stools. 07/10/12   Reuben Likes, MD  fexofenadine-pseudoephedrine (ALLEGRA-D) 60-120 MG per tablet Take 1 tablet by mouth every 12 (twelve) hours. 07/10/12   Reuben Likes, MD  gabapentin (NEURONTIN) 300 MG capsule Take 1 capsule (300 mg total) by mouth 3 (three) times daily. 02/28/13   Annamarie Dawley, MD  HYDROcodone-acetaminophen (NORCO/VICODIN) 5-325 MG per tablet Take 1 tablet by mouth every 4 (four) hours as needed for pain. 02/28/13   Annamarie Dawley, MD  meloxicam (MOBIC) 7.5 MG tablet 1 po bid with food 03/22/12   Ivery Quale, PA-C  methocarbamol (ROBAXIN) 500 MG tablet 2 po tid for spasm 03/22/12   Ivery Quale, PA-C  ondansetron (ZOFRAN ODT) 4 MG disintegrating tablet Take 1 tablet (4 mg total) by mouth every 8 (eight) hours as needed for nausea or vomiting. 12/02/14   Ria Clock, PA  predniSONE (DELTASONE) 10 MG tablet Take 2 tablets (20 mg total) by mouth daily. 02/28/13   Annamarie Dawley, MD   BP 126/80 mmHg  Pulse 87  Temp(Src) 98.6 F (37 C) (Oral)  Resp 16  SpO2 98% Physical Exam  Constitutional: He is oriented to person, place, and time. He appears well-developed and well-nourished. No distress.  HENT:  Head: Normocephalic and atraumatic.  Eyes: Conjunctivae are normal. No scleral icterus.  Cardiovascular: Normal rate, regular rhythm and normal heart sounds.  Pulmonary/Chest: Effort normal and breath sounds normal.  Abdominal: Soft. Normal appearance. He exhibits no distension. Bowel sounds are increased. There is no tenderness. There is no rigidity, no rebound, no guarding and no CVA tenderness.  Musculoskeletal: Normal range of motion.  Neurological: He is alert and oriented to person, place, and time.   Skin: Skin is warm and dry. No rash noted. No erythema.  Psychiatric: He has a normal mood and affect. His behavior is normal.  Nursing note and vitals reviewed.   ED Course  Procedures (including critical care time) Labs Review Labs Reviewed - No data to display  Imaging Review No results found.   MDM   1. Gastroenteritis   Provided patient with 4mg  oral dose of zofran while in clinic and he was able to tolerate oral fluids comfortably.  Suspect self limited viral gastroenteritis Zofran and oral rehydration strategies at home Follow up PCP if symptoms become worse or persistent. ER if suddenly worse, severe, blood in stool or persistent abdominal pain    Ria ClockJennifer Lee H Presson, GeorgiaPA 12/02/14 1238

## 2014-12-02 NOTE — Discharge Instructions (Signed)

## 2014-12-02 NOTE — ED Notes (Signed)
C/o intermittent abd pain onset 2 days Sx include f/n/d, decreased appetite Denies urinary sx and having normal BM Alert, no signs of acute distress.

## 2015-11-09 ENCOUNTER — Encounter (HOSPITAL_COMMUNITY): Payer: Self-pay | Admitting: Emergency Medicine

## 2015-11-09 ENCOUNTER — Emergency Department (HOSPITAL_COMMUNITY): Payer: 59

## 2015-11-09 ENCOUNTER — Emergency Department (HOSPITAL_COMMUNITY)
Admission: EM | Admit: 2015-11-09 | Discharge: 2015-11-09 | Disposition: A | Discharge status: Home / Self Care | Payer: 59 | Attending: Emergency Medicine | Admitting: Emergency Medicine

## 2015-11-09 DIAGNOSIS — Y929 Unspecified place or not applicable: Secondary | ICD-10-CM | POA: Diagnosis not present

## 2015-11-09 DIAGNOSIS — Y998 Other external cause status: Secondary | ICD-10-CM | POA: Insufficient documentation

## 2015-11-09 DIAGNOSIS — S29012A Strain of muscle and tendon of back wall of thorax, initial encounter: Secondary | ICD-10-CM

## 2015-11-09 DIAGNOSIS — S4992XA Unspecified injury of left shoulder and upper arm, initial encounter: Secondary | ICD-10-CM | POA: Diagnosis present

## 2015-11-09 DIAGNOSIS — S233XXA Sprain of ligaments of thoracic spine, initial encounter: Secondary | ICD-10-CM | POA: Diagnosis not present

## 2015-11-09 DIAGNOSIS — F17200 Nicotine dependence, unspecified, uncomplicated: Secondary | ICD-10-CM | POA: Insufficient documentation

## 2015-11-09 DIAGNOSIS — S46812A Strain of other muscles, fascia and tendons at shoulder and upper arm level, left arm, initial encounter: Secondary | ICD-10-CM | POA: Diagnosis not present

## 2015-11-09 DIAGNOSIS — Y939 Activity, unspecified: Secondary | ICD-10-CM | POA: Diagnosis not present

## 2015-11-09 MED ORDER — METHOCARBAMOL 500 MG PO TABS: 500.0000 mg | ORAL_TABLET | Freq: Three times a day (TID) | ORAL | Status: AC

## 2015-11-09 MED ORDER — HYDROCODONE-ACETAMINOPHEN 5-325 MG PO TABS: 1.0000 | ORAL_TABLET | ORAL | Status: DC | PRN

## 2015-11-09 MED ORDER — DICLOFENAC SODIUM 75 MG PO TBEC: 75.0000 mg | DELAYED_RELEASE_TABLET | Freq: Two times a day (BID) | ORAL | Status: DC
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
## 2015-11-17 ENCOUNTER — Ambulatory Visit (INDEPENDENT_AMBULATORY_CARE_PROVIDER_SITE_OTHER): Payer: 59 | Admitting: Orthopaedic Surgery

## 2015-11-17 ENCOUNTER — Encounter: Payer: Self-pay | Admitting: Orthopaedic Surgery

## 2015-11-17 ENCOUNTER — Ambulatory Visit (INDEPENDENT_AMBULATORY_CARE_PROVIDER_SITE_OTHER): Payer: 59

## 2015-11-17 VITALS — BP 146/72 | HR 75 | Temp 98.3°F | Ht 65.0 in | Wt 209.0 lb

## 2015-11-17 DIAGNOSIS — M545 Low back pain, unspecified: Secondary | ICD-10-CM

## 2015-11-17 MED ORDER — HYDROCODONE-ACETAMINOPHEN 5-325 MG PO TABS: 1.0000 | ORAL_TABLET | ORAL | Status: AC | PRN

## 2015-11-17 MED ORDER — NAPROXEN 500 MG PO TABS: 500.0000 mg | ORAL_TABLET | Freq: Two times a day (BID) | ORAL | Status: AC
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
## 2015-11-17 NOTE — Patient Instructions (Signed)

## 2015-11-18 ENCOUNTER — Ambulatory Visit: Payer: 59 | Admitting: Orthopaedic Surgery

## 2015-12-02 ENCOUNTER — Encounter: Payer: Self-pay | Admitting: Orthopaedic Surgery

## 2015-12-02 ENCOUNTER — Ambulatory Visit (INDEPENDENT_AMBULATORY_CARE_PROVIDER_SITE_OTHER): Payer: 59 | Admitting: Orthopaedic Surgery

## 2015-12-02 VITALS — BP 127/79 | HR 82 | Temp 97.9°F | Ht 65.0 in | Wt 209.0 lb

## 2015-12-02 DIAGNOSIS — M545 Low back pain, unspecified: Secondary | ICD-10-CM
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
## 2015-12-30 ENCOUNTER — Ambulatory Visit: Payer: 59 | Admitting: Orthopaedic Surgery

## 2016-04-21 ENCOUNTER — Emergency Department (HOSPITAL_COMMUNITY): Payer: 59

## 2016-04-21 ENCOUNTER — Emergency Department (HOSPITAL_COMMUNITY)
Admission: EM | Admit: 2016-04-21 | Discharge: 2016-04-22 | Disposition: A | Discharge status: Home / Self Care | Payer: 59 | Attending: Emergency Medicine | Admitting: Emergency Medicine

## 2016-04-21 ENCOUNTER — Encounter (HOSPITAL_COMMUNITY): Payer: Self-pay | Admitting: *Deleted

## 2016-04-21 DIAGNOSIS — N50812 Left testicular pain: Secondary | ICD-10-CM | POA: Diagnosis present

## 2016-04-21 DIAGNOSIS — F17210 Nicotine dependence, cigarettes, uncomplicated: Secondary | ICD-10-CM | POA: Insufficient documentation

## 2016-04-21 DIAGNOSIS — N451 Epididymitis: Secondary | ICD-10-CM

## 2016-04-21 MED ORDER — AZITHROMYCIN 250 MG PO TABS: 1000.0000 mg | ORAL_TABLET | Freq: Once | ORAL | Status: DC

## 2016-04-21 MED ORDER — CEFTRIAXONE SODIUM 250 MG IJ SOLR
250.0000 mg | INTRAMUSCULAR | Status: DC
  Administered 2016-04-22: 250 mg via INTRAMUSCULAR
  Filled 2016-04-21: qty 250

## 2016-04-21 MED ORDER — OXYCODONE-ACETAMINOPHEN 5-325 MG PO TABS
1.0000 | ORAL_TABLET | ORAL | Status: DC | PRN
  Administered 2016-04-21: 1 via ORAL

## 2016-04-21 MED ORDER — OXYCODONE-ACETAMINOPHEN 5-325 MG PO TABS
ORAL_TABLET | ORAL | Status: AC
  Filled 2016-04-21: qty 1

## 2016-04-21 MED ORDER — AZITHROMYCIN 250 MG PO TABS
1000.0000 mg | ORAL_TABLET | Freq: Once | ORAL | Status: AC
  Administered 2016-04-22: 1000 mg via ORAL
  Filled 2016-04-21: qty 4

## 2016-04-21 MED ORDER — DOXYCYCLINE HYCLATE 100 MG PO TABS
100.0000 mg | ORAL_TABLET | Freq: Once | ORAL | Status: AC
  Administered 2016-04-22: 100 mg via ORAL
  Filled 2016-04-21: qty 1

## 2016-04-21 MED ORDER — LIDOCAINE HCL (PF) 1 % IJ SOLN
5.0000 mL | Freq: Once | INTRAMUSCULAR | Status: AC
  Administered 2016-04-22: 5 mL via INTRADERMAL
  Filled 2016-04-21: qty 5

## 2016-04-21 NOTE — ED Provider Notes (Signed)
[facility]-EMERGENCY DEPT Provider Note   CSN: [id number] Arrival date & time: 04/21/16  1804     History   Chief Complaint Chief Complaint  Patient presents with  . Testicle Pain    HPI Jacob Webster is a 25 y.o. male with a hx of scoliosis, allergic rhinitis presents to the Emergency Department complaining of gradual, persistent, progressively worsening pelvic pain, left leg pain and left testicle pain and swelling onset 1-2 weeks ago.  Pain was described as throbbing, waxing and waning, but not resolving.  Pt reports no penile pain or discharge.  He reports no urinary symptoms.  Pt reports he is sexually active with 2 male partners.  Pt took ibuprofen without significant relief.  Pt denies fever, chills, nausea, vomiting, dysuria, blood in semen.       The history is provided by the patient and medical records. No language interpreter was used.    History reviewed. No pertinent past medical history.  Patient Active Problem List   Diagnosis Date Noted  . HYPERMETROPIA 12/14/2008  . ALLERGIC RHINITIS, SEASONAL 12/14/2008  . 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 No pertinent surgical history.     Home Medications    Prior to Admission medications   Medication Sig Start Date End Date Taking? Authorizing Provider  doxycycline (VIBRAMYCIN) 100 MG capsule Take 1 capsule (100 mg total) by mouth 2 (two) times daily. 04/22/16   Akon Reinoso, PA-C  HYDROcodone-acetaminophen (NORCO/VICODIN) 5-325 MG tablet Take 1 tablet by mouth every 4 (four) hours as needed for moderate pain (Must last 30 days.  Do not take and drive a car or use machinery.). 11/17/15   Darreld Mclean, MD  methocarbamol (ROBAXIN) 500 MG tablet Take 1 tablet (500 mg total) by mouth 3 (three) times daily. 11/09/15   Ivery Quale, PA-C  naproxen (NAPROSYN) 500 MG tablet Take 1 tablet (500 mg total) by mouth 2 (two) times daily with a meal. 11/17/15   Darreld Mclean, MD    Family History No family  history on file.  Social History Social History  Substance Use Topics  . Smoking status: Current Every Day Smoker    Packs/day: 0.00    Types: Cigars  . Smokeless tobacco: Current User    Types: Chew  . Alcohol use 0.0 oz/week     Comment: occ     Allergies   Review of patient's allergies indicates no known allergies.   Review of Systems Review of Systems  Gastrointestinal: Positive for abdominal pain.  Genitourinary: Positive for scrotal swelling and testicular pain.  All other systems reviewed and are negative.    Physical Exam Updated Vital Signs BP 128/74 (BP Location: Right Arm)   Pulse 69   Temp 98.6 F (37 C) (Oral)   Resp 18   SpO2 100%   Physical Exam  Constitutional: He appears well-developed and well-nourished. No distress.  Awake, alert, nontoxic appearance  HENT:  Head: Normocephalic and atraumatic.  Mouth/Throat: Oropharynx is clear and moist. No oropharyngeal exudate.  Eyes: Conjunctivae are normal. No scleral icterus.  Neck: Normal range of motion. Neck supple.  Cardiovascular: Normal rate, regular rhythm and intact distal pulses.   Pulmonary/Chest: Effort normal and breath sounds normal. No respiratory distress. He has no wheezes.  Equal chest expansion  Abdominal: Soft. Bowel sounds are normal. He exhibits no mass. There is no tenderness. There is no rebound and no guarding. Hernia confirmed negative in the right inguinal area and confirmed negative in the left inguinal area.  Genitourinary:  Penis normal. Right testis shows no mass, no swelling and no tenderness. Right testis is descended. Left testis shows tenderness. Left testis shows no mass and no swelling. Left testis is descended. Circumcised. No phimosis, paraphimosis, hypospadias, penile erythema or penile tenderness. No discharge found.  Musculoskeletal: Normal range of motion. He exhibits no edema.  Lymphadenopathy: Inguinal adenopathy noted on the left side. No inguinal adenopathy noted  on the right side.  Neurological: He is alert.  Speech is clear and goal oriented Moves extremities without ataxia  Skin: Skin is warm and dry. He is not diaphoretic.  Psychiatric: He has a normal mood and affect.  Nursing note and vitals reviewed.    ED Treatments / Results  Labs (all labs ordered are listed, but only abnormal results are displayed) Labs Reviewed  URINALYSIS, ROUTINE W REFLEX MICROSCOPIC (NOT AT [facility])  RPR  HIV ANTIBODY (ROUTINE TESTING)  GC/CHLAMYDIA PROBE AMP ([place]) NOT AT [facility]    Radiology US Scrotum  Result Date: 04/21/2016 CLINICAL DATA:  Patient with history of left scrotal pain for 2 weeks. No known injury. EXAM: SCROTAL ULTRASOUND DOPPLER ULTRASOUND OF THE TESTICLES TECHNIQUE: Complete ultrasound examination of the testicles, epididymis, and other scrotal structures was performed. Color and spectral Doppler ultrasound were also utilized to evaluate blood flow to the testicles. COMPARISON:  None. FINDINGS: Right testicle Measurements: 3.8 x 2.0 x 2.6 cm. No mass or microlithiasis visualized. Left testicle Measurements: 3.5 x 1.9 x 2.4 cm. No mass or microlithiasis visualized. Right epididymis:  Normal in size and appearance. Left epididymis:  Enlarged and hyperemic. Hydrocele:  None visualized. Varicocele:  None visualized. Pulsed Doppler interrogation of both testes demonstrates normal low resistance arterial and venous waveforms bilaterally. IMPRESSION: The left epididymis is enlarged and has increased vascularity, suggestive of acute epididymitis. Electronically Signed   By: Annia Belt M.D.   On: 04/21/2016 19:48   [place] Art/ven Flow Abd Pelv Doppler  Result Date: 04/21/2016 CLINICAL DATA:  Patient with history of left scrotal pain for 2 weeks. No known injury. EXAM: SCROTAL ULTRASOUND DOPPLER ULTRASOUND OF THE TESTICLES TECHNIQUE: Complete ultrasound examination of the testicles, epididymis, and other scrotal structures was performed. Color and spectral  Doppler ultrasound were also utilized to evaluate blood flow to the testicles. COMPARISON:  None. FINDINGS: Right testicle Measurements: 3.8 x 2.0 x 2.6 cm. No mass or microlithiasis visualized. Left testicle Measurements: 3.5 x 1.9 x 2.4 cm. No mass or microlithiasis visualized. Right epididymis:  Normal in size and appearance. Left epididymis:  Enlarged and hyperemic. Hydrocele:  None visualized. Varicocele:  None visualized. Pulsed Doppler interrogation of both testes demonstrates normal low resistance arterial and venous waveforms bilaterally. IMPRESSION: The left epididymis is enlarged and has increased vascularity, suggestive of acute epididymitis. Electronically Signed   By: Annia Belt M.D.   On: 04/21/2016 19:48    Procedures Procedures (including critical care time)  Medications Ordered in ED Medications  oxyCODONE-acetaminophen (PERCOCET/ROXICET) 5-325 MG per tablet (not administered)  cefTRIAXone (ROCEPHIN) injection 250 mg (250 mg Intramuscular Given 04/22/16 0022)  lidocaine (PF) (XYLOCAINE) 1 % injection 5 mL (5 mLs Intradermal Given 04/22/16 0022)  doxycycline (VIBRA-TABS) tablet 100 mg (100 mg Oral Given 04/22/16 0023)  azithromycin (ZITHROMAX) tablet 1,000 mg (1,000 mg Oral Given 04/22/16 0023)     Initial Impression / Assessment and Plan / ED Course  I have reviewed the triage vital signs and the nursing notes.  Pertinent labs & imaging results that were available during my care of the patient  were reviewed by me and considered in my medical decision making (see chart for details).  Clinical Course  Value Comment By Time  Temp: 98.1 F (36.7 C) VSS Dierdre ForthHannah Merlin Ege, PA-C 09/15 2229  US Scrotum The left epididymis is enlarged and has increased vascularity, suggestive of acute epididymitis. Dahlia ClientHannah Cebert Dettmann, PA-C 09/15 2247  Leukocytes, UA: NEGATIVE No evidence of UTI Dierdre ForthHannah Watson Robarge, PA-C 09/16 0046    Rudene ChristiansEric S Rappa with epididymitis via US and consistent with  clinical exam.  Doubt torsion due to length of symptoms and US confirms adequate blood flow.  Concern for possible STD.  Pt treated with Azithro, Rocephin and doxycycline.  No evidence of sepsis.  Pt d/c home in good condition with Abx and close PCP follow-up.  Discussed need to inform all partners if STD screen comes back positive.    Final Clinical Impressions(s) / ED Diagnoses   Final diagnoses:  Left testicular pain  Epididymitis    New Prescriptions New Prescriptions   DOXYCYCLINE (VIBRAMYCIN) 100 MG CAPSULE    Take 1 capsule (100 mg total) by mouth 2 (two) times daily.     Dahlia ClientHannah Denetria Luevanos, PA-C 04/22/16 0049    Charlynne Panderavid Hsienta Yao, MD 04/22/16 1355

## 2016-04-21 NOTE — ED Triage Notes (Signed)
Pt is here with pelvic pain and down left leg and is complaining of left testicle pain and swelling.  Pt states pain started one week ago

## 2016-04-21 NOTE — ED Notes (Signed)
Family up at desk looking for update, apologized for wait and explained that he will be back to a treatment room shortly.

## 2016-04-22 LAB — URINALYSIS, ROUTINE W REFLEX MICROSCOPIC
Bilirubin Urine: NEGATIVE
Glucose, UA: NEGATIVE mg/dL
Hgb urine dipstick: NEGATIVE
KETONES UR: NEGATIVE mg/dL
LEUKOCYTES UA: NEGATIVE
NITRITE: NEGATIVE
PH: 6 (ref 5.0–8.0)
PROTEIN: NEGATIVE mg/dL
Specific Gravity, Urine: 1.022 (ref 1.005–1.030)

## 2016-04-22 MED ORDER — DOXYCYCLINE HYCLATE 100 MG PO CAPS: 100.0000 mg | ORAL_CAPSULE | Freq: Two times a day (BID) | ORAL | 0 refills | Status: AC

## 2016-04-22 NOTE — Discharge Instructions (Signed)
1. Medications: Doxycycline, usual home medications 2. Treatment: rest, drink plenty of fluids, ibuprofen for pain control, elevate scrotum 3. Follow Up: Please followup with your primary doctor in 2 days for discussion of your diagnoses and further evaluation after today's visit; if you do not have a primary care doctor use the resource guide provided to find one; Please return to the ER for worsening symptoms, fever, vomiting or other concerns

## 2016-04-22 NOTE — ED Notes (Signed)
The pt was seen by the pa before myself  See triage notes

## 2016-04-23 ENCOUNTER — Other Ambulatory Visit: Payer: Self-pay | Admitting: Orthopaedic Surgery

## 2016-04-23 LAB — RPR: RPR: NONREACTIVE

## 2016-04-23 LAB — HIV ANTIBODY (ROUTINE TESTING W REFLEX): HIV Screen 4th Generation wRfx: NONREACTIVE

## 2016-04-24 LAB — GC/CHLAMYDIA PROBE AMP (~~LOC~~) NOT AT ARMC
Chlamydia: NEGATIVE
Neisseria Gonorrhea: NEGATIVE

## 2016-10-28 IMAGING — US US SCROTUM
1 series · 14 of 25 positions shown · non-contrast
Comparison: None.

CLINICAL DATA: Patient with history of left scrotal pain for 2
weeks. No known injury.

EXAM:
SCROTAL ULTRASOUND
DOPPLER ULTRASOUND OF THE TESTICLES
TECHNIQUE: Complete ultrasound examination of the testicles, epididymis, and
other scrotal structures was performed. Color and spectral Doppler
ultrasound were also utilized to evaluate blood flow to the
testicles.

[Series 1: us scrotum · 0.06mm/px · 14 of 52 slices shown]
[im 1/52]
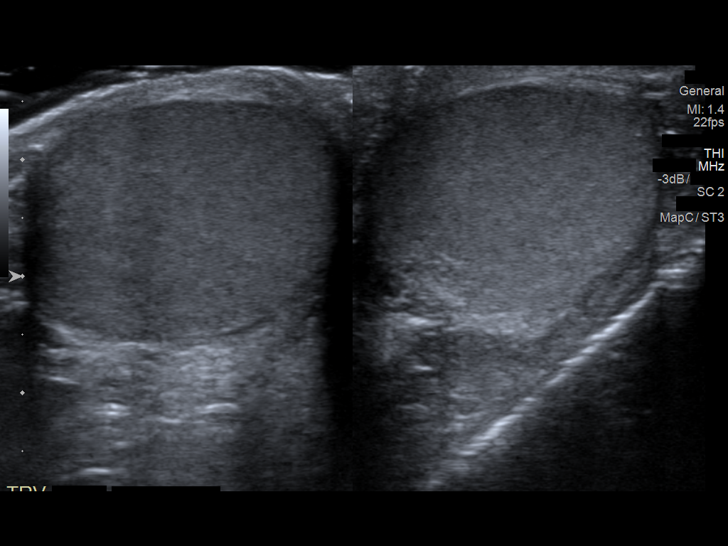
[im 5/52]
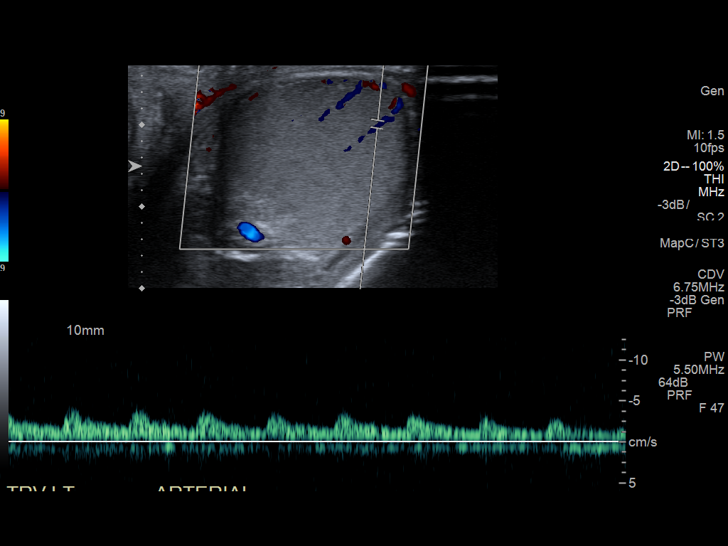
[im 9/52]
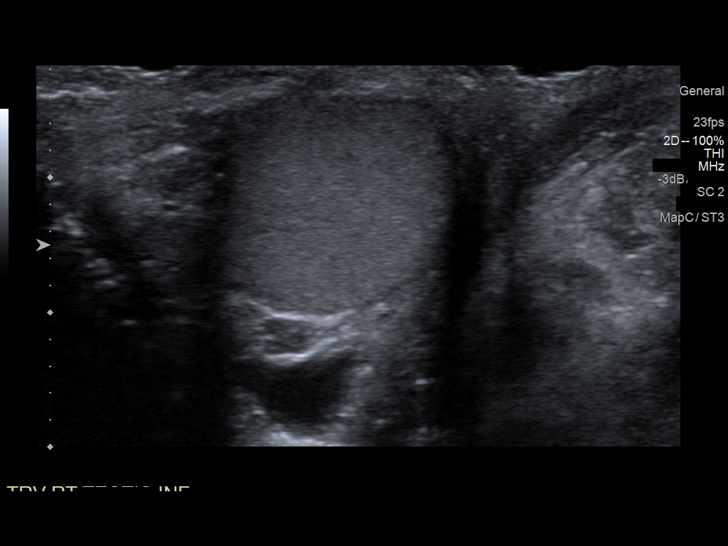
[im 13/52]
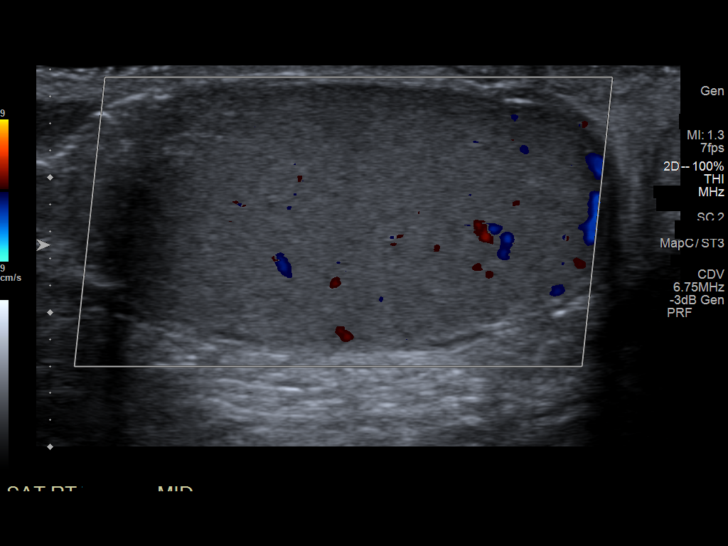
[im 18/52]
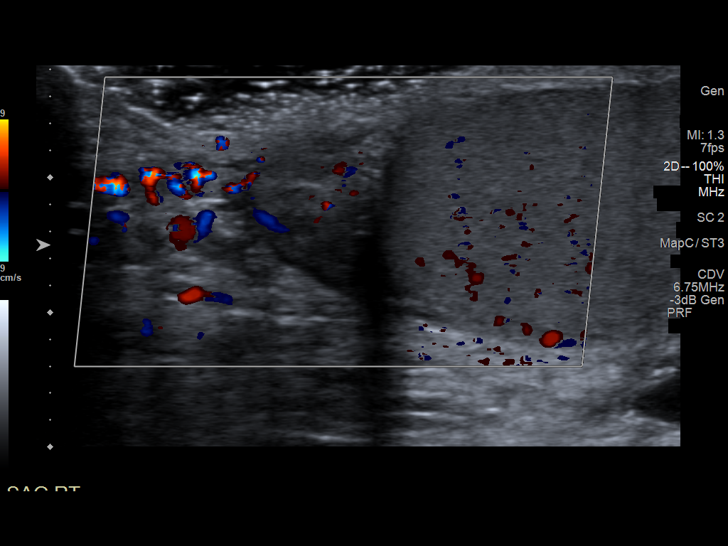
[im 20/52]
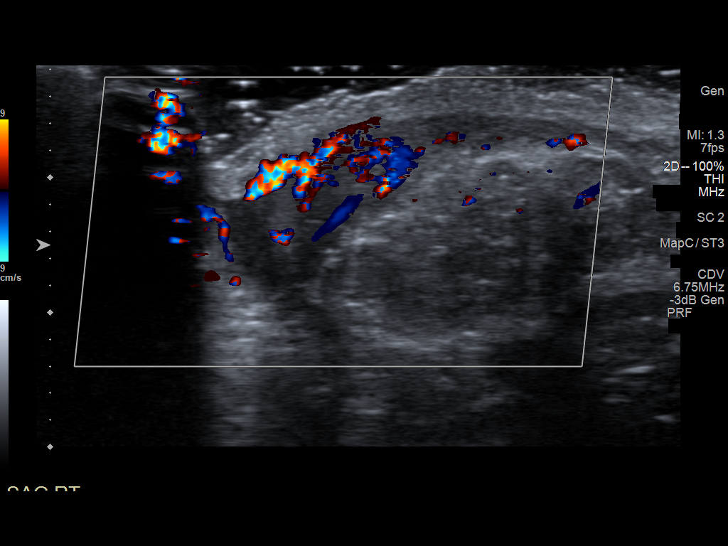
[im 24/52]
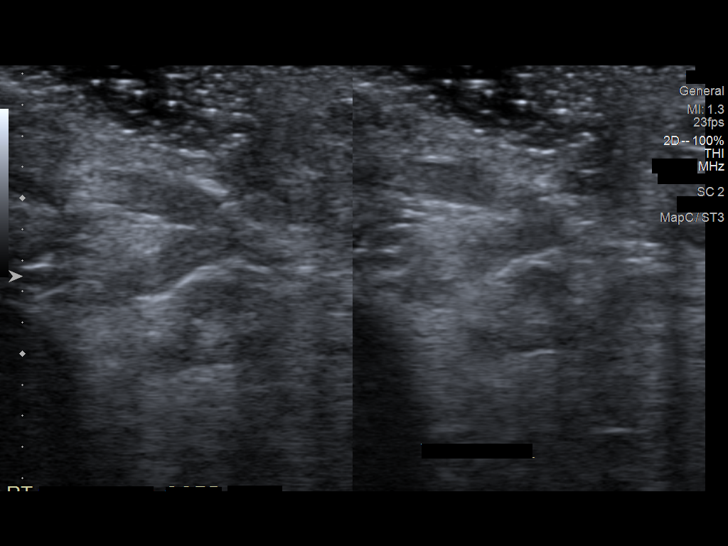
[im 28/52]
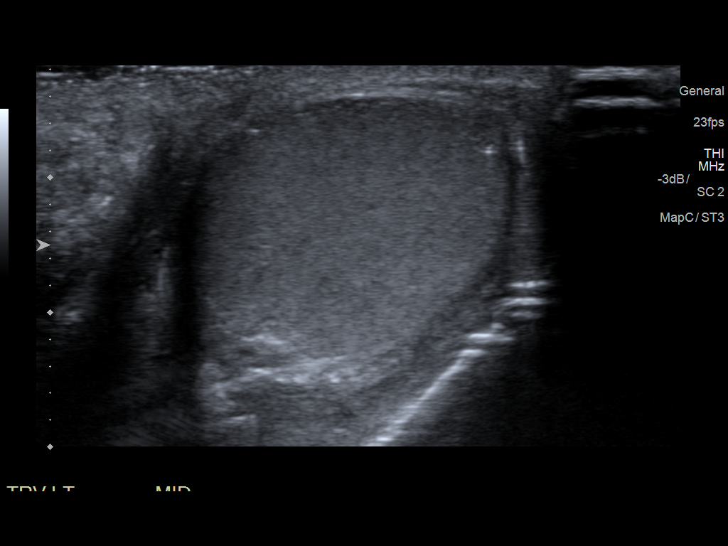
[im 32/52]
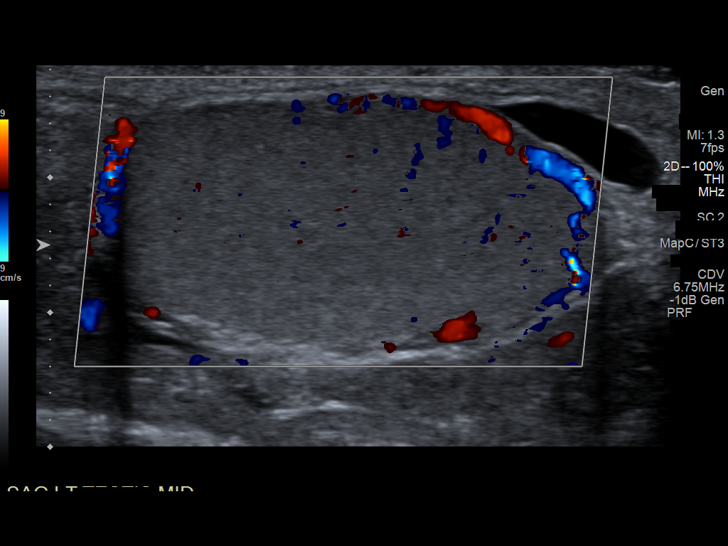
[im 35/52]
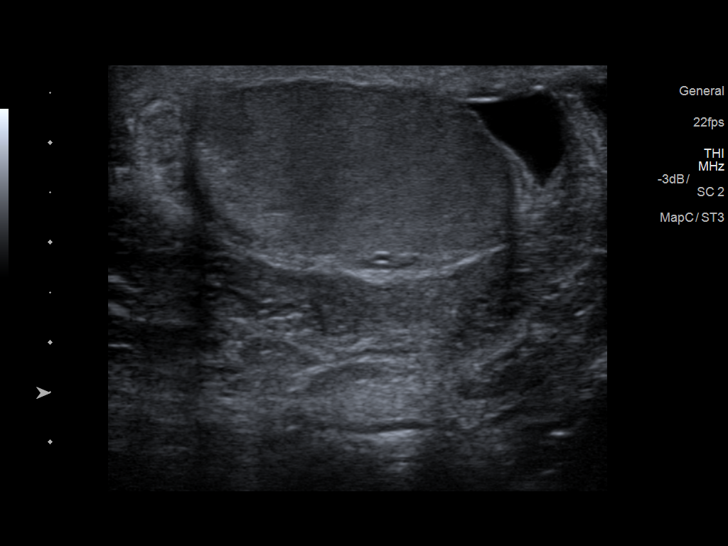
[im 39/52]
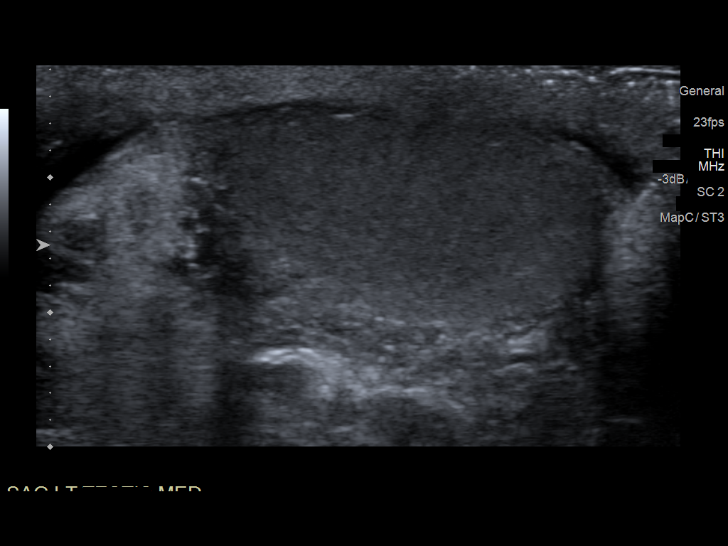
[im 43/52]
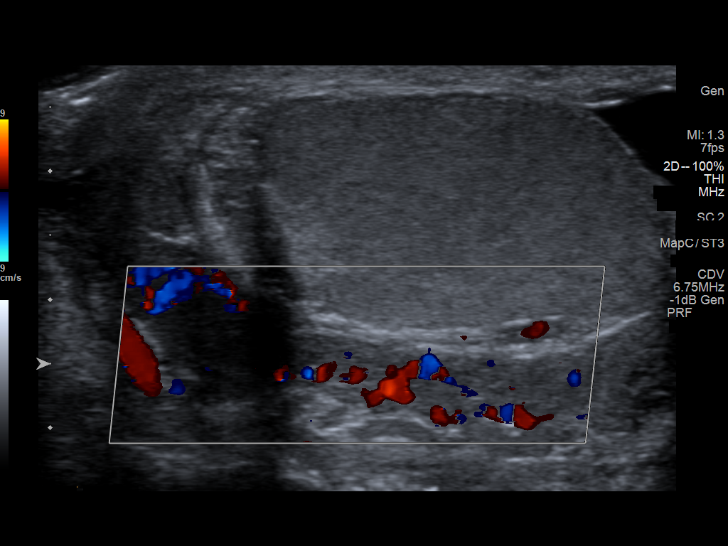
[im 47/52]
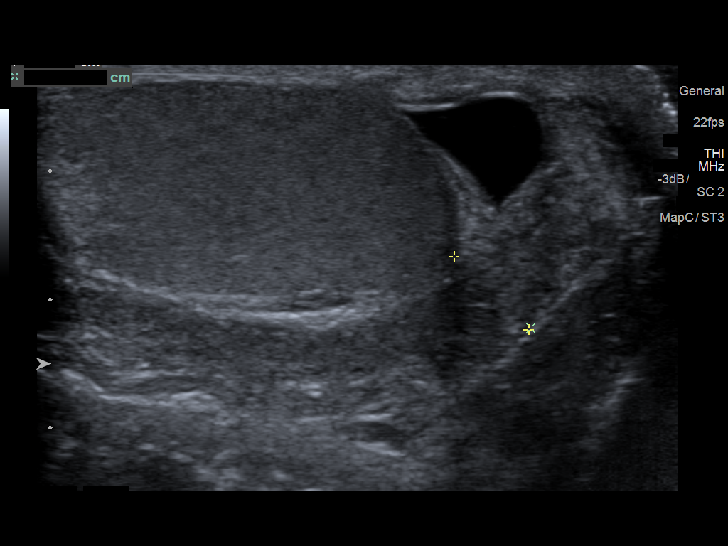
[im 52/52]
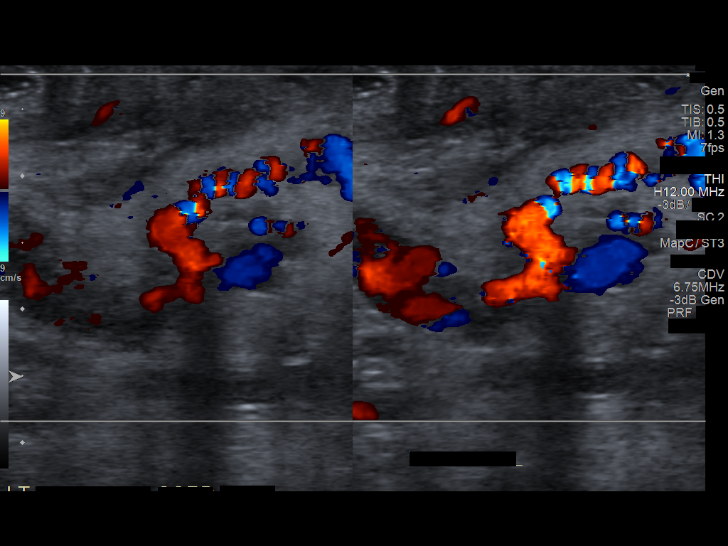

[14 of 25 positions shown; findings below may reference images not displayed]

FINDINGS: Right testicle

Measurements: 3.8 x 2.0 x 2.6 cm. No mass or microlithiasis
visualized.

Left testicle

Measurements: 3.5 x 1.9 x 2.4 cm. No mass or microlithiasis
visualized.

Right epididymis:  Normal in size and appearance.

Left epididymis:  Enlarged and hyperemic.

Hydrocele:  None visualized.

Varicocele:  None visualized.

Pulsed Doppler interrogation of both testes demonstrates normal low
resistance arterial and venous waveforms bilaterally.
IMPRESSION: The left epididymis is enlarged and has increased vascularity,
suggestive of acute epididymitis.
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
## 2018-01-29 ENCOUNTER — Encounter (HOSPITAL_COMMUNITY): Payer: Self-pay

## 2018-01-29 ENCOUNTER — Emergency Department (HOSPITAL_COMMUNITY)
Admission: EM | Admit: 2018-01-29 | Discharge: 2018-01-29 | Disposition: A | Discharge status: Home / Self Care | Payer: 59 | Attending: Emergency Medicine | Admitting: Emergency Medicine

## 2018-01-29 ENCOUNTER — Emergency Department (HOSPITAL_COMMUNITY): Payer: 59

## 2018-01-29 DIAGNOSIS — R0602 Shortness of breath: Secondary | ICD-10-CM | POA: Diagnosis not present

## 2018-01-29 DIAGNOSIS — F17210 Nicotine dependence, cigarettes, uncomplicated: Secondary | ICD-10-CM | POA: Insufficient documentation

## 2018-01-29 DIAGNOSIS — R079 Chest pain, unspecified: Secondary | ICD-10-CM | POA: Insufficient documentation

## 2018-01-29 DIAGNOSIS — R42 Dizziness and giddiness: Secondary | ICD-10-CM | POA: Insufficient documentation

## 2018-01-29 DIAGNOSIS — R002 Palpitations: Secondary | ICD-10-CM | POA: Insufficient documentation

## 2018-01-29 LAB — BASIC METABOLIC PANEL
ANION GAP: 7 (ref 5–15)
BUN: 11 mg/dL (ref 6–20)
CALCIUM: 9.6 mg/dL (ref 8.9–10.3)
CO2: 28 mmol/L (ref 22–32)
CREATININE: 1.07 mg/dL (ref 0.61–1.24)
Chloride: 106 mmol/L (ref 98–111)
GFR calc non Af Amer: >60 mL/min (ref >60)
Glucose, Bld: 94 mg/dL (ref 70–99)
Potassium: 4 mmol/L (ref 3.5–5.1)
SODIUM: 141 mmol/L (ref 135–145)

## 2018-01-29 LAB — CBC
HCT: 46.2 % (ref 39.0–52.0)
Hemoglobin: 16.3 g/dL (ref 13.0–17.0)
MCH: 30.6 pg (ref 26.0–34.0)
MCHC: 35.3 g/dL (ref 30.0–36.0)
MCV: 86.7 fL (ref 78.0–100.0)
PLATELETS: 263 10*3/uL (ref 150–400)
RBC: 5.33 MIL/uL (ref 4.22–5.81)
RDW: 12 % (ref 11.5–15.5)
WBC: 5.9 10*3/uL (ref 4.0–10.5)

## 2018-01-29 LAB — I-STAT TROPONIN, ED: TROPONIN I, POC: 0.01 ng/mL (ref 0.00–0.08)

## 2018-01-29 LAB — D-DIMER, QUANTITATIVE (NOT AT ARMC)

## 2018-01-29 NOTE — ED Notes (Signed)
Pt stands for ortho VS on own ability. No light-headed feelings. Strong and steady on feet. 

## 2018-01-29 NOTE — ED Provider Notes (Signed)
Patient placed in Quick Look pathway, seen and evaluated   Chief Complaint: chest pain  HPI:   Patient reports that he was sitting at his desk and he got light headed and felt like he was going to pass out while having chest pain.  He feels slightly short of breath when the chest pain happens.  No known provocation factors.    ROS: no cough  Physical Exam:   Gen: No distress  Neuro: Awake and Alert  Skin: Warm    Focused Exam:RRR, no MGR   Initiation of care has begun. The patient has been counseled on the process, plan, and necessity for staying for the completion/evaluation, and the remainder of the medical screening examination    Jacob ClayHammond, Jacob Torpey W, PA-C 01/29/18 1854    Terrilee FilesButler, Michael C, MD 01/30/18 1121

## 2018-01-29 NOTE — Discharge Instructions (Signed)
Please follow-up with cardiology for further evaluation and treatment.  You may need to wear a Holter monitor to figure out what is going on.  Make sure to drink plenty of fluids.  Avoid caffeine.  Please return to the emergency department if you develop any new or worsening symptoms.

## 2018-01-29 NOTE — ED Triage Notes (Signed)
Pt presents with 2 week h/o palpitations and intermittent chest pain.  Pt reports he will feel pulse race and sometimes he feels a skipped beat.  Pt gets short of breath with episodes, pt also reports feeling lightheaded with episodes.

## 2018-01-29 NOTE — ED Provider Notes (Signed)
[facility] Provider Note   CSN: [id number] Arrival date & time: 01/29/18  1829     History   Chief Complaint Chief Complaint  Patient presents with  . Near Syncope    HPI Jacob Webster is a 27 y.o. male with history of hypermetropia, allergic rhinitis, scoliosis who presents with a 2-week history of intermittent palpitations, chest pain, shortness of breath, and associated lightheadedness.  The episodes occur out of nowhere.  He states he was sitting in his desk at work today when this happened.  He states that sometimes he feels like his heart is racing and other times he feels like it is going really slow.  He is also had some heartburn which he states seems to be unrelated.  He denies any recent long trips, surgeries, known cancer, history of blood clots, new leg pain or swelling.  He reports he used to smoke and quit about a month and a half ago.  He denies any other drug use, including cocaine.  He does not take any medications at home for his symptoms.  He has had increased stress lately.  HPI  History reviewed. No pertinent past medical history.  Patient Active Problem List   Diagnosis Date Noted  . HYPERMETROPIA 12/14/2008  . ALLERGIC RHINITIS, SEASONAL 12/14/2008  . 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 No pertinent surgical history.      Home Medications    Prior to Admission medications   Medication Sig Start Date End Date Taking? Authorizing Provider  doxycycline (VIBRAMYCIN) 100 MG capsule Take 1 capsule (100 mg total) by mouth 2 (two) times daily. Patient not taking: Reported on 01/29/2018 04/22/16   Muthersbaugh, Dahlia ClientHannah, PA-C  HYDROcodone-acetaminophen (NORCO/VICODIN) 5-325 MG tablet Take 1 tablet by mouth every 4 (four) hours as needed for moderate pain (Must last 30 days.  Do not take and drive a car or use machinery.). Patient not taking: Reported on 01/29/2018 11/17/15   Darreld McleanKeeling, Wayne, MD  methocarbamol  (ROBAXIN) 500 MG tablet Take 1 tablet (500 mg total) by mouth 3 (three) times daily. Patient not taking: Reported on 01/29/2018 11/09/15   Ivery QualeBryant, Hobson, PA-C  naproxen (NAPROSYN) 500 MG tablet Take 1 tablet (500 mg total) by mouth 2 (two) times daily with a meal. Patient not taking: Reported on 01/29/2018 11/17/15   Darreld McleanKeeling, Wayne, MD    Family History History reviewed. No pertinent family history.  Social History Social History   Tobacco Use  . Smoking status: Current Every Day Smoker    Packs/day: 0.00    Types: Cigars  . Smokeless tobacco: Current User    Types: Chew  Substance Use Topics  . Alcohol use: Yes    Alcohol/week: 0.0 oz    Comment: occ  . Drug use: No     Allergies   Patient has no known allergies.   Review of Systems Review of Systems  Constitutional: Negative for chills and fever.  HENT: Negative for facial swelling and sore throat.   Respiratory: Positive for shortness of breath.   Cardiovascular: Positive for chest pain and palpitations. Negative for leg swelling.  Gastrointestinal: Negative for abdominal pain, nausea and vomiting.  Genitourinary: Negative for dysuria.  Musculoskeletal: Negative for back pain.  Skin: Negative for rash and wound.  Neurological: Positive for light-headedness. Negative for headaches.  Psychiatric/Behavioral: The patient is not nervous/anxious.      Physical Exam Updated Vital Signs BP 121/75 (BP Location: Right Arm)   Pulse 72  Temp 98.2 F (36.8 C) (Oral)   Resp 14   Ht 5\' 5"  (1.651 m)   Wt 95.3 kg (210 lb)   SpO2 98%   BMI 34.95 kg/m   Physical Exam  Constitutional: He appears well-developed and well-nourished. No distress.  HENT:  Head: Normocephalic and atraumatic.  Eyes: Pupils are equal, round, and reactive to light. Conjunctivae are normal. Right eye exhibits no discharge. Left eye exhibits no discharge. No scleral icterus.  Neck: Normal range of motion. Neck supple. No thyromegaly present.    Cardiovascular: Normal rate, regular rhythm and normal heart sounds. Exam reveals no gallop and no friction rub.  No murmur heard. Pulmonary/Chest: Effort normal and breath sounds normal. No stridor. No respiratory distress. He has no wheezes. He has no rales.  Abdominal: Soft. Bowel sounds are normal. He exhibits no distension. There is no tenderness. There is no rebound and no guarding.  Musculoskeletal: He exhibits no edema.  Lymphadenopathy:    He has no cervical adenopathy.  Neurological: He is alert. Coordination normal.  Skin: Skin is warm and dry. No rash noted. He is not diaphoretic. No pallor.  Psychiatric: He has a normal mood and affect.  Nursing note and vitals reviewed.    ED Treatments / Results  Labs (all labs ordered are listed, but only abnormal results are displayed) Labs Reviewed  BASIC METABOLIC PANEL  CBC  D-DIMER, QUANTITATIVE (NOT AT [facility])  I-STAT TROPONIN, ED    EKG EKG Interpretation  Date/Time:  Tuesday January 29 2018 18:37:37 EDT Ventricular Rate:  81 PR Interval:  106 QRS Duration: 94 QT Interval:  364 QTC Calculation: 422 R Axis:   71 Text Interpretation:  Sinus rhythm with short PR with occasional Premature ventricular complexes Otherwise normal ECG nl intervals similar pattern to prior 12/13, ectopy new Confirmed by Meridee Score [id number]) on 01/29/2018 7:39:32 PM   Radiology Dg Chest 2 View  Result Date: 01/29/2018 CLINICAL DATA:  Chest pain EXAM: CHEST - 2 VIEW COMPARISON:  None. FINDINGS: Normal heart size. Normal mediastinal contour. No pneumothorax. No pleural effusion. Lungs appear clear, with no acute consolidative airspace disease and no pulmonary edema. IMPRESSION: No active cardiopulmonary disease. Electronically Signed   By: Delbert Phenix M.D.   On: 01/29/2018 19:36    Procedures Procedures (including critical care time)  Medications Ordered in ED Medications - No data to display   Initial Impression / Assessment and Plan / ED  Course  I have reviewed the triage vital signs and the nursing notes.  Pertinent labs & imaging results that were available during my care of the patient were reviewed by me and considered in my medical decision making (see chart for details).     Patient with intermittent palpitations.  CBC, BMP, troponin, d-dimer negative.  EKG shows NSR with some PVCs which are new from previous tracing.  Patient is asymptomatic in the ED.  No abnormalities found on telemetry.  Will discharge home with follow-up to cardiology for Holter monitor and further evaluation.  Return precautions discussed.  Patient understands and agrees with plan.  Patient vitals stable throughout ED course and discharged in satisfactory condition. I discussed patient case with Dr. Charm Barges who guided the patient's management and agrees with plan.  Final Clinical Impressions(s) / ED Diagnoses   Final diagnoses:  Palpitations  Intermittent lightheadedness    ED Discharge Orders    None       Verdis Prime 01/29/18 2347    Terrilee Files, MD  01/30/18 1122  

## 2019-07-15 ENCOUNTER — Other Ambulatory Visit: Payer: Self-pay

## 2019-07-15 DIAGNOSIS — Z20822 Contact with and (suspected) exposure to covid-19: Secondary | ICD-10-CM

## 2019-07-17 LAB — NOVEL CORONAVIRUS, NAA: SARS-CoV-2, NAA: NOT DETECTED
